# Patient Record
Sex: Female | Born: 1960 | Race: Black or African American | Hispanic: No
Health system: Southern US, Community
[De-identification: ages and names within clinical notes are randomized; demographics above are authoritative.]

---

## 2011-04-10 ENCOUNTER — Emergency Department: Payer: Self-pay | Admitting: Emergency Medicine

## 2012-06-14 IMAGING — CR DG RIBS 2V*R*
1 series · 2 of 2 positions shown · non-contrast
Comparison: none

REASON FOR EXAM: right sided rib pain
COMMENTS:

PROCEDURE:     DXR - DXR RIBS RIGHT UNILATERAL  - April 10, 2011 [DATE]
RESULT:     Two views of the right ribs were obtained. No fracture or other
acute bony abnormality is identified. No pneumothorax or pleural effusion is
seen.

[Series 1: view not recorded · 0.17mm/px · 2 of 2 slices shown]
[im 1/2]
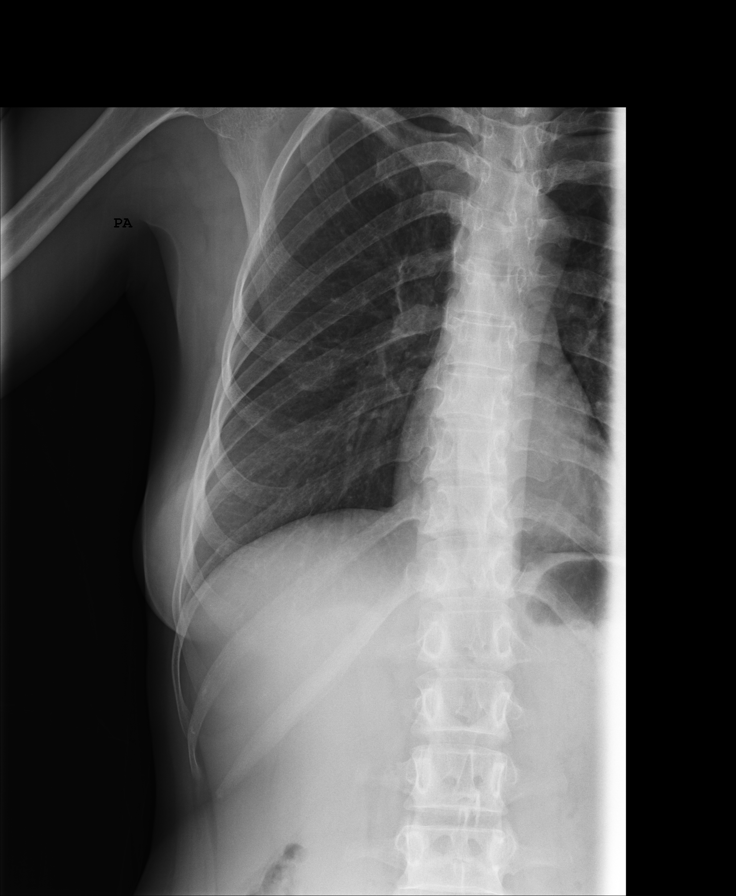
[im 2/2]
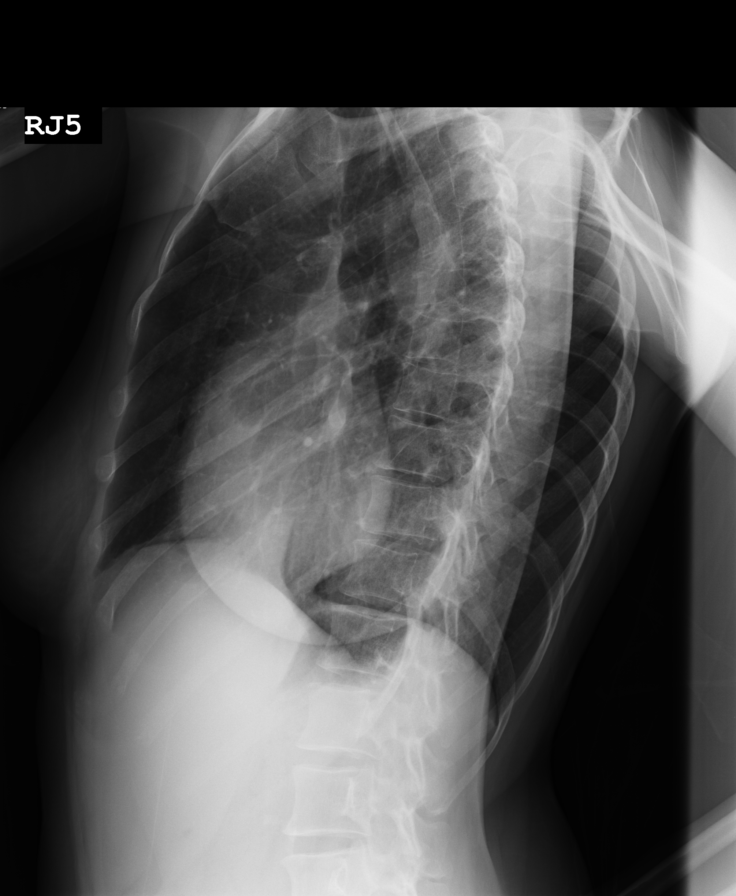

[2 of 2 positions shown; findings below may reference images not displayed]

IMPRESSION: 1.     No significant abnormalities are identified.

## 2020-03-11 ENCOUNTER — Ambulatory Visit: Payer: Self-pay | Attending: Internal Medicine

## 2020-03-11 DIAGNOSIS — Z23 Encounter for immunization: Secondary | ICD-10-CM

## 2020-03-11 NOTE — Progress Notes (Signed)
   Covid-[facility] Vaccination [facility]  Name:  Americus Scheurich    MRN: [id number] DOB: May 14, 1961  03/11/2020  Ms. Correia was observed post Covid-19 immunization for 15 minutes without incident. She was provided with Vaccine Information Sheet and instruction to access the V-Safe system.   Ms. Uncapher was instructed to call 911 with any severe reactions post vaccine: Marland Kitchen Difficulty breathing  . Swelling of face and throat  . A fast heartbeat  . A bad rash all over body  . Dizziness and weakness   Immunizations Administered    Name Date Dose VIS Date Route   Pfizer COVID-19 Vaccine 03/11/2020 10:53 AM 0.3 mL 11/11/2019 Intramuscular   Manufacturer: [employer], [employer]   Lot: [id number]   NDC: [address]-9

## 2020-04-10 ENCOUNTER — Ambulatory Visit: Payer: Self-pay | Attending: Internal Medicine

## 2020-04-10 DIAGNOSIS — Z23 Encounter for immunization: Secondary | ICD-10-CM

## 2020-04-10 NOTE — Progress Notes (Signed)
   Covid-19 Vaccination Clinic  Name:  Cindy Palmer    MRN: [id number] DOB: Feb 10, 1961  04/10/2020  Cindy Palmer was observed post Covid-19 immunization for 15 minutes without incident. She was provided with Vaccine Information Sheet and instruction to access the V-Safe system.   Cindy Palmer was instructed to call 911 with any severe reactions post vaccine: Marland Kitchen Difficulty breathing  . Swelling of face and throat  . A fast heartbeat  . A bad rash all over body  . Dizziness and weakness   Immunizations Administered    Name Date Dose VIS Date Route   Pfizer COVID-19 Vaccine 04/10/2020  4:14 PM 0.3 mL 01/25/2019 Intramuscular   Manufacturer: [employer], [employer]   Lot: [id number]   NDC: [address]-1
